# Patient Record
Sex: Male | Born: 1970 | Race: White | Hispanic: No | Marital: Married | Smoking: Never smoker
Health system: Southern US, Community
[De-identification: ages and names within clinical notes are randomized; demographics above are authoritative.]

## PROBLEM LIST (undated history)

## (undated) DIAGNOSIS — K469 Unspecified abdominal hernia without obstruction or gangrene: Secondary | ICD-10-CM

## (undated) HISTORY — PX: HERNIA REPAIR: SHX51

## (undated) HISTORY — PX: OTHER SURGICAL HISTORY: SHX169

## (undated) HISTORY — DX: Unspecified abdominal hernia without obstruction or gangrene: K46.9

---

## 2008-09-27 ENCOUNTER — Emergency Department: Payer: Self-pay | Admitting: Internal Medicine

## 2009-07-02 ENCOUNTER — Emergency Department: Payer: Self-pay | Admitting: Emergency Medicine

## 2009-10-29 ENCOUNTER — Ambulatory Visit: Payer: Self-pay | Admitting: Cardiovascular Disease

## 2009-10-29 DIAGNOSIS — R0789 Other chest pain: Secondary | ICD-10-CM

## 2009-11-14 IMAGING — CR DG HAND COMPLETE 3+V*L*
1 series · 3 of 3 positions shown · non-contrast
Comparison: none

REASON FOR EXAM: injury
COMMENTS:

PROCEDURE:     DXR - DXR HAND LT COMPLETE  W/OBLIQUES  - September 27, 2008 [DATE]
RESULT:     A tiny metallic density is noted in the subcutaneous region
along the thenar eminence. No acute bony abnormality identified.

[Series 1: view not recorded · 0.17mm/px · 3 of 3 slices shown]
[im 1/3]
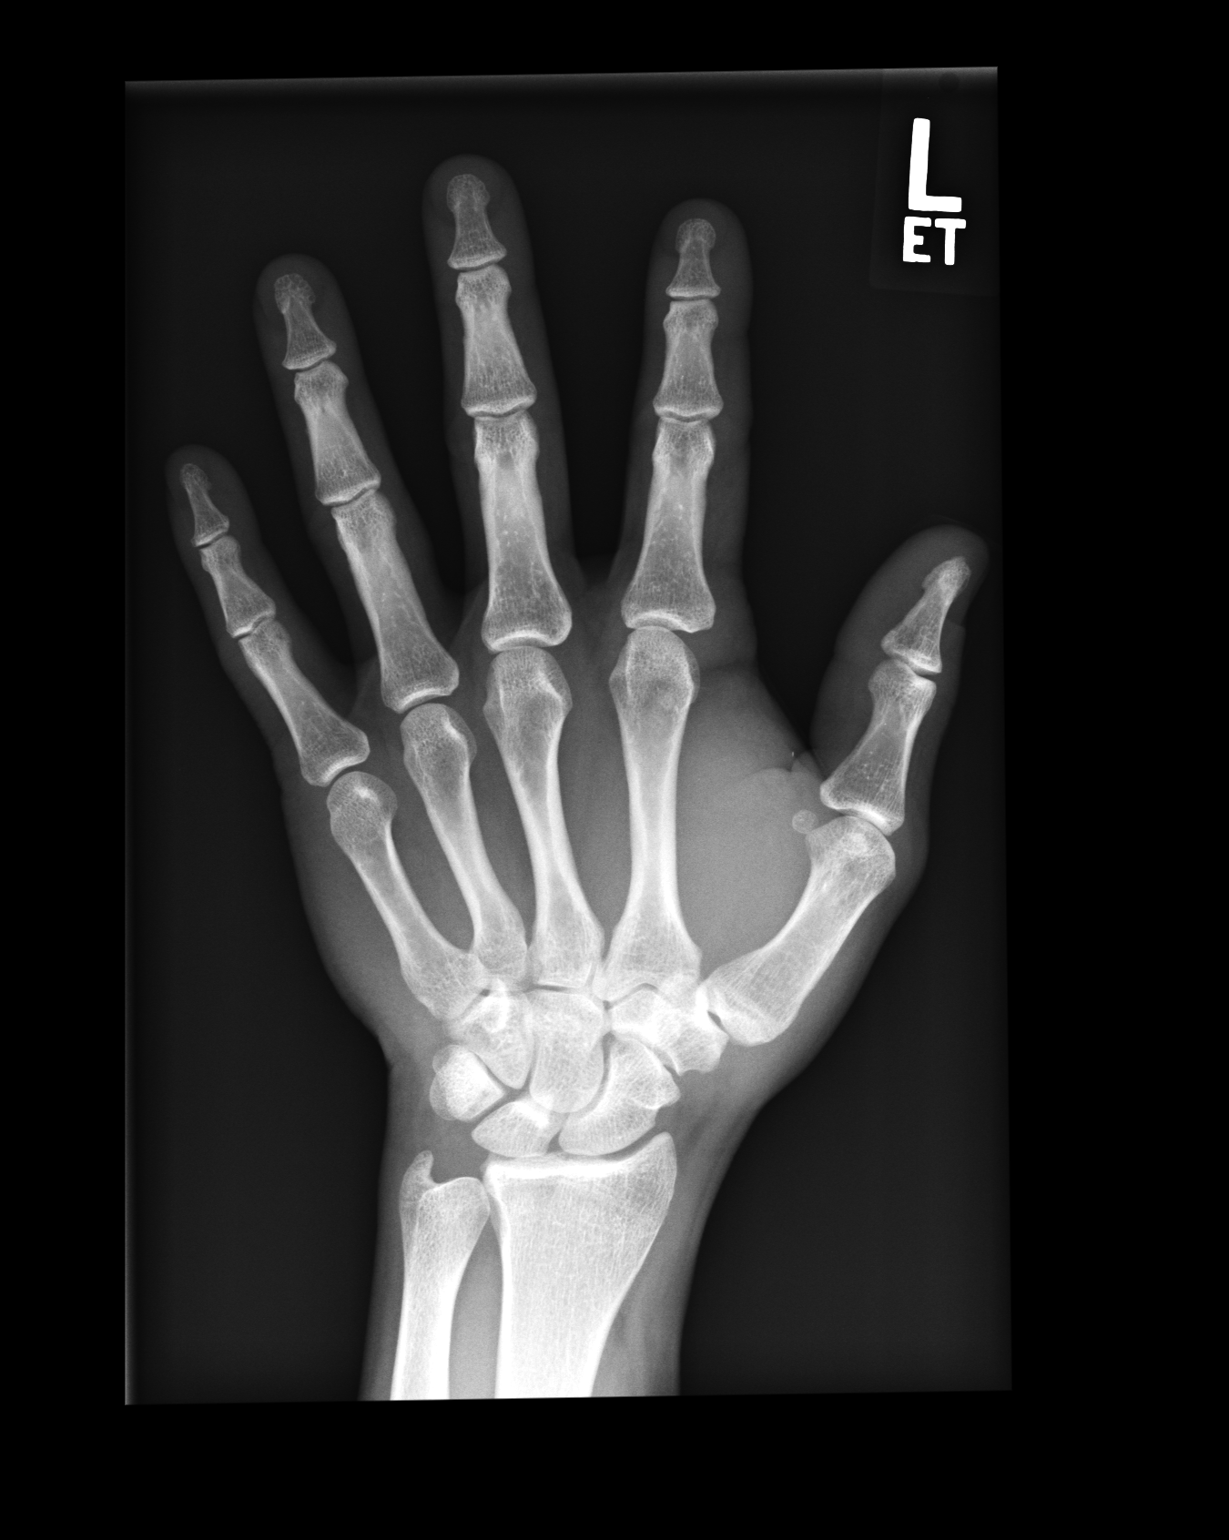
[im 2/3]
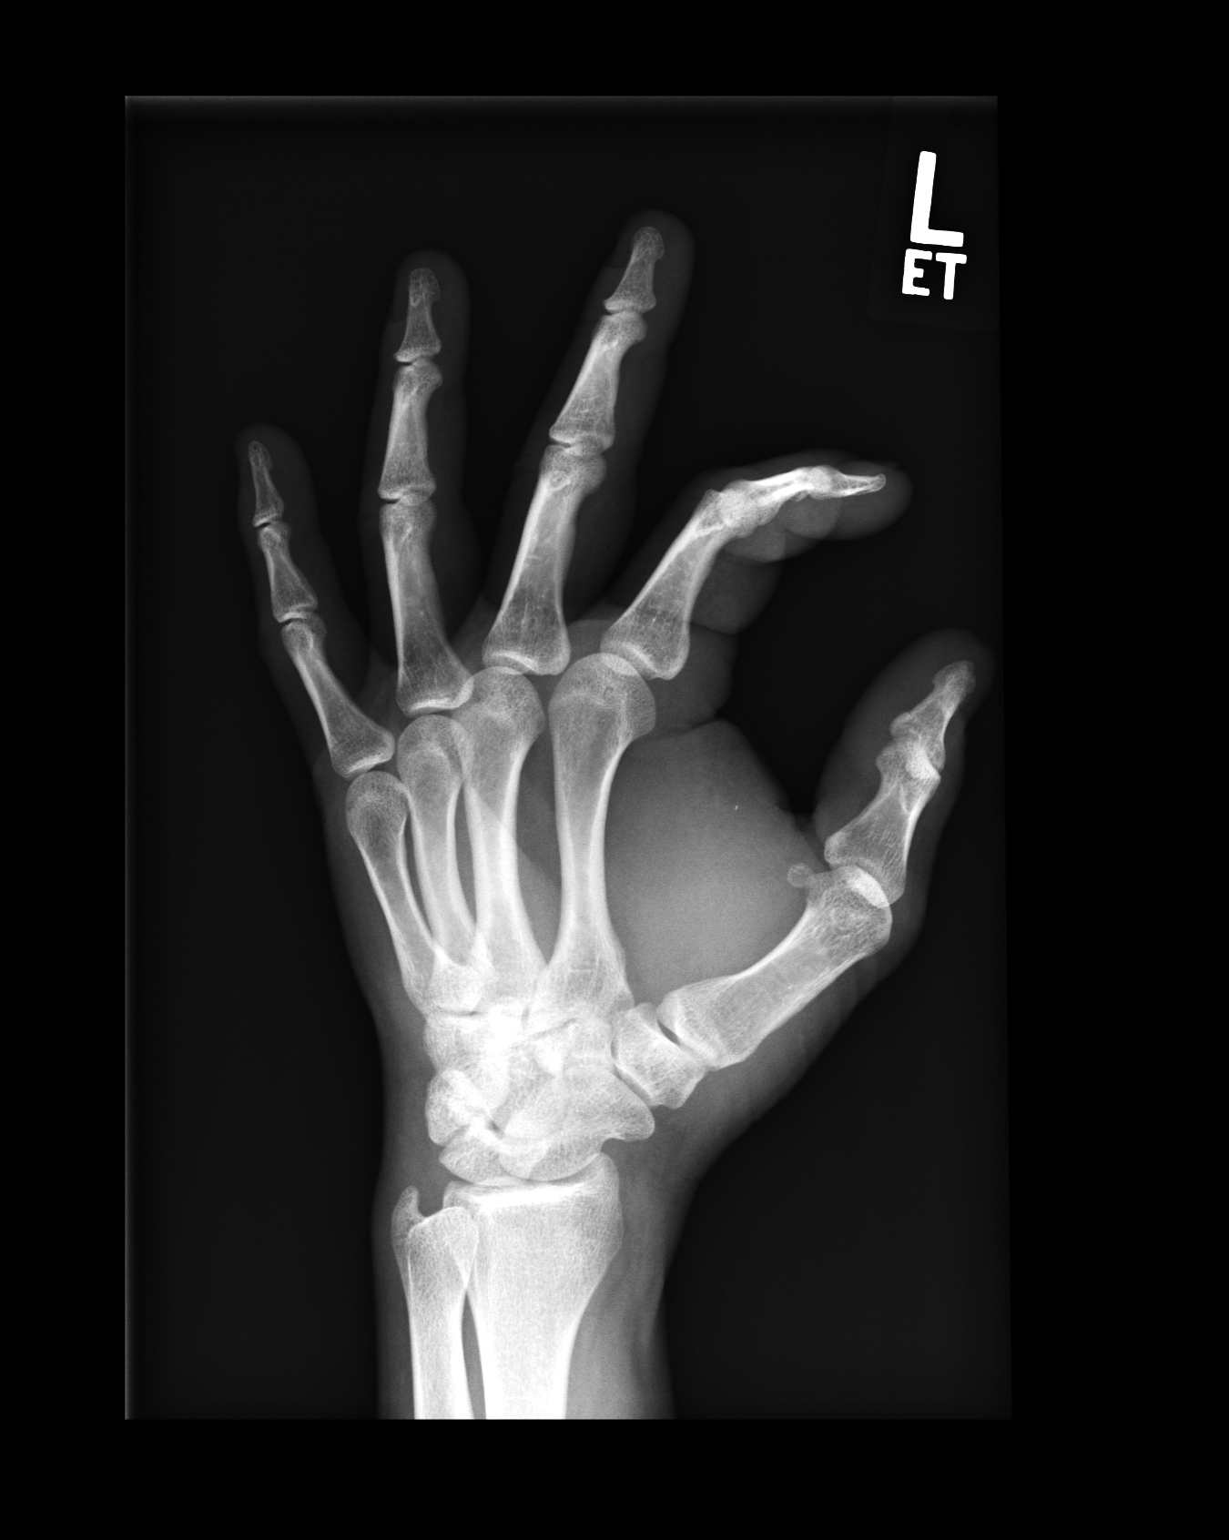
[im 3/3]
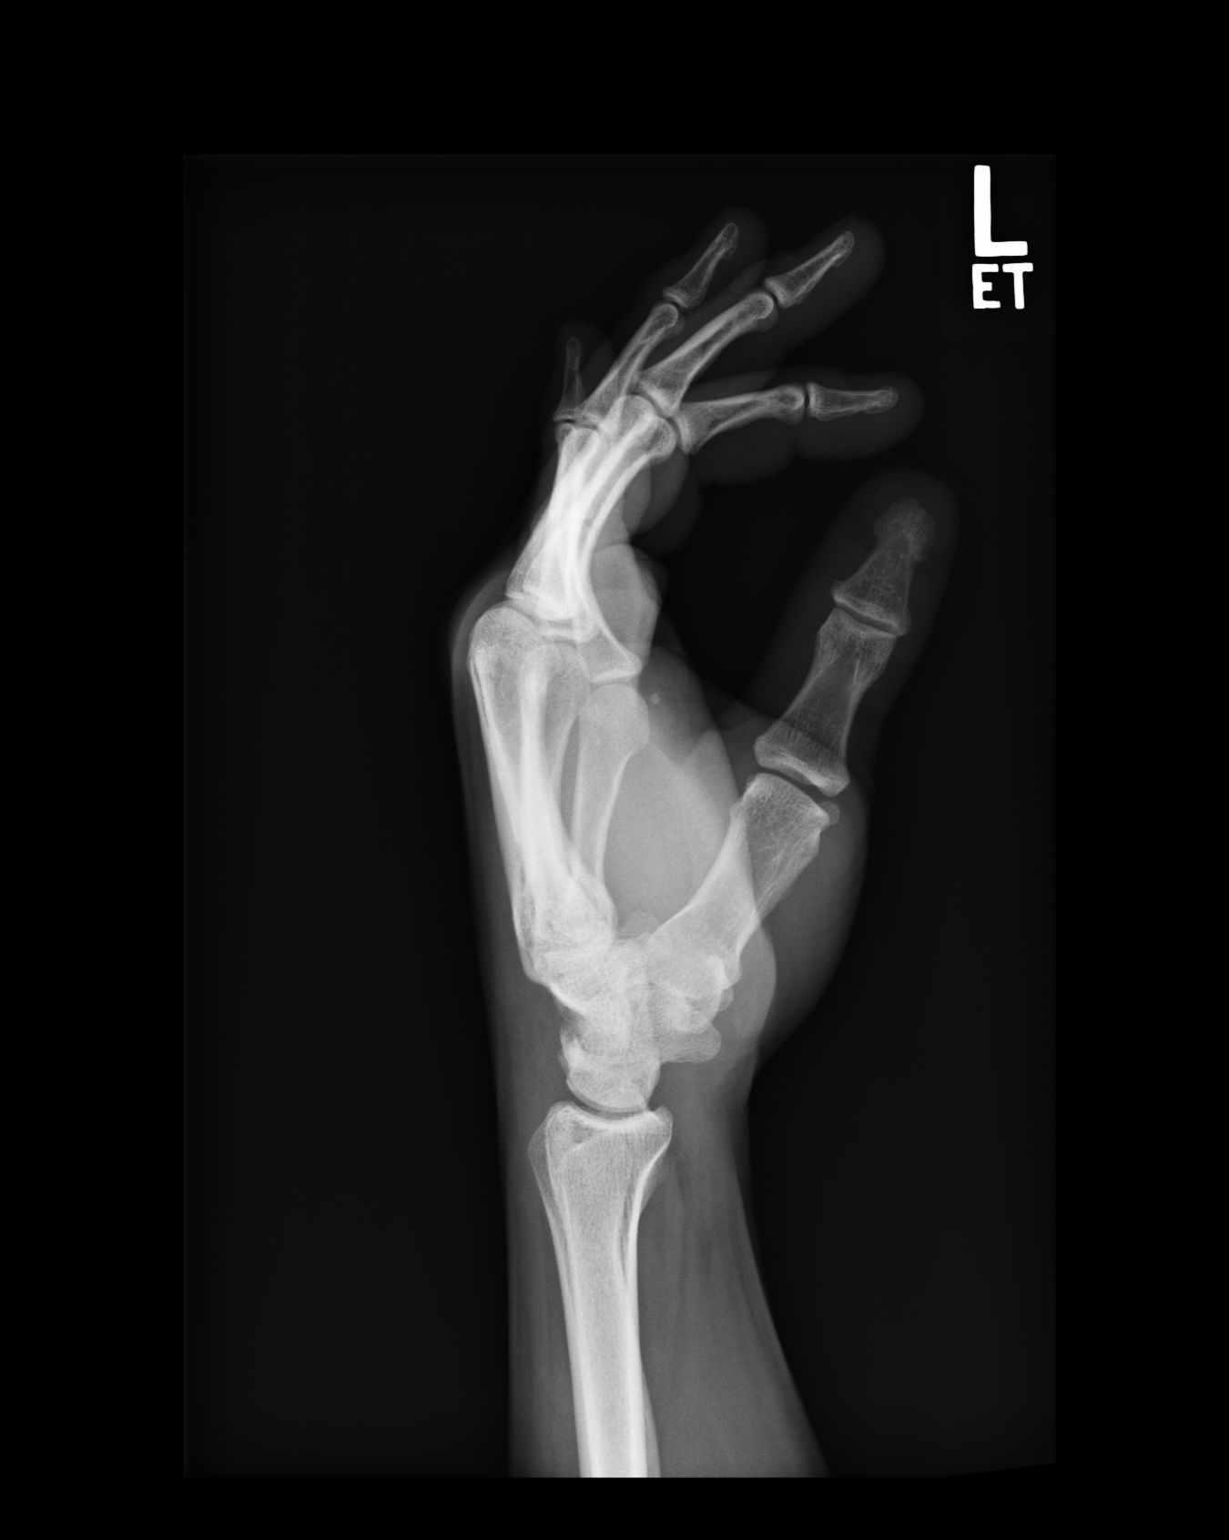

[3 of 3 positions shown; findings below may reference images not displayed]

IMPRESSION: Tiny, sand-like possible foreign body in the subcutaneous
region of the left thenar eminence. No fracture is noted.

## 2010-08-25 NOTE — Progress Notes (Signed)
Summary: PHI  PHI   Imported By: Harlon Flor 11/05/2009 09:07:01  _____________________________________________________________________  External Attachment:    Type:   Image     Comment:   External Document

## 2010-08-25 NOTE — Assessment & Plan Note (Signed)
Summary: NP6/AMD   Visit Type:  New patient Referring Provider:  self ref Primary Provider:  Mila Merry, MD  CC:   some chest pains since sept. seen pcp and thought it was gerd - gave him some medicine and it has not gotten any better. Tyler Buckley  History of Present Illness: Mr. Tyler Buckley is a 40 year old gentleman, patient of Dr. Mila Merry, who presents for evaluation of chest discomfort.  He states that his symptoms started in the fall of last year. His symptoms would come on sometimes at rest, lasting for one to 2 hours and sometimes for several minutes at a time. It was always in the same location which is in the central mediastinal area, sub-xiphoid region. He took medications for GERD and this did not seem to help. There was no relation with exertion, he did not seem to think there was any positional component to his discomfort. He seemed to notice it more in the evenings when he was sitting. He is uncertain of his family history unremarkable check and make sure that everything was okay. He denies lifting anything heavy that he does sometimes lift items at work as he works in [employer] in [employer].  Preventive Screening-Counseling & Management  Alcohol-Tobacco     Smoking Status: never  Caffeine-Diet-Exercise     Does Patient Exercise: yes      Drug Use:  no.    Current Problems (verified): 1)  Chest Discomfort  (ICD-786.59)  Current Medications (verified): 1)  None  Allergies (verified): No Known Drug Allergies  Past History:  Past Medical History: hernia  Past Surgical History: hernia repaired  Family History: Father: unknown Mother: healthy  Social History: Full Time Married  Tobacco Use - No.  Alcohol Use - no Regular Exercise - yes Drug Use - no Smoking Status:  never Does Patient Exercise:  yes Drug Use:  no  Review of Systems       The patient complains of chest pain.  The patient denies fever, weight loss, weight gain, vision loss, decreased  hearing, hoarseness, syncope, dyspnea on exertion, peripheral edema, prolonged cough, abdominal pain, incontinence, muscle weakness, depression, and enlarged lymph nodes.    Vital Signs:  Patient profile:   40 year old male Height:      70 inches Weight:      186.25 pounds BMI:     26.82 Pulse rate:   70 / minute Pulse rhythm:   regular BP sitting:   124 / 86  (left arm) Cuff size:   regular  Vitals Entered By: Mercer Pod (October 29, 2009 3:38 PM)  Physical Exam  General:  well-appearing male in no apparent distress, HEENT exam is benign, oropharynx is clear, neck is supple with no JVP or carotid bruits, heart sounds are regular with S1-S2 and no murmurs appreciated, lungs are clear to auscultation with no wheezes Rales, abdominal exam is benign, no significant lower extremity edema, neurologic exam is nonfocal the skin is warm and dry, pulses are equal and symmetrical in his upper and lower extremities.    EKG  Procedure date:  10/29/2009  Findings:      normal sinus rhythm with rate 70 beats per minute, borderline ST changes in leads V3 through V6, leads 2, 3, aVF.  Impression & Recommendations:  Problem # 1:  CHEST DISCOMFORT (ICD-786.59) etiology of his chest pain is likely non-ischemic. It is certainly possible that he may have had some component of pericarditis. He does have some very nonspecific  ST changes in numerous leads that this may certainly be a benign finding. There is no exertional component to his discomfort. He does not believe it is GERD and proton pump inhibitors has not helped his discomfort.  I have asked him to try nonsteroidal anti-inflammatories if his pain recurs. It has been a week since he's had any discomfort at all. Medication such as ibuprofen may help his symptoms if he does have pericarditis or costochondritis or other muscular ligamental issue if he has no improvement on nonsteroidal anti-inflammatories, we could potentially try low-dose  long-acting nitroglycerin for possible spasm including coronary spasm or esophageal spasm. Lastly if all else fails, he could potentially have a GI workup for possible hiatal hernia or other GI issue.

## 2011-02-08 ENCOUNTER — Encounter: Payer: Self-pay | Admitting: Cardiovascular Disease

## 2015-01-04 ENCOUNTER — Emergency Department
Admission: EM | Admit: 2015-01-04 | Discharge: 2015-01-04 | Disposition: A | Discharge status: Home / Self Care | Payer: No Typology Code available for payment source

## 2015-01-04 NOTE — ED Notes (Signed)
Pt was seen for a post accident drug screen, states that he doesn't need to be eval

## 2015-07-29 ENCOUNTER — Ambulatory Visit: Payer: Self-pay | Admitting: Physician Assistant

## 2015-07-29 ENCOUNTER — Encounter: Payer: Self-pay | Admitting: Physician Assistant

## 2015-07-29 VITALS — BP 120/80 | HR 96 | Temp 98.3°F

## 2015-07-29 DIAGNOSIS — R0982 Postnasal drip: Secondary | ICD-10-CM

## 2015-07-29 MED ORDER — FLUTICASONE PROPIONATE 50 MCG/ACT NA SUSP: 2.0000 | Freq: Every day | NASAL | Status: DC

## 2015-07-29 NOTE — Progress Notes (Signed)
S: C/o runny nose and congestion for 3 days, no fever, chills, cp/sob, v/d; mucus was green this am but clear throughout the day, cough is sporadic, has pressure across face, feels better than he did yesterday  Using otc meds: robitussin  O: PE: vitals wnl, nad, perrl eomi, normocephalic, tms dull, nasal mucosa red and swollen, throat injected, neck supple no lymph, lungs c t a, cv rrr, neuro intact  A:  Acute viral uri   P: flonase, drink fluids, continue regular meds , use otc meds of choice, return if not improving in 5 days, return earlier if worsening

## 2016-06-13 ENCOUNTER — Encounter: Payer: Self-pay | Admitting: Physician Assistant

## 2016-06-13 ENCOUNTER — Ambulatory Visit: Payer: Self-pay | Admitting: Physician Assistant

## 2016-06-13 VITALS — BP 110/80 | HR 80 | Temp 98.6°F

## 2016-06-13 DIAGNOSIS — R51 Headache: Secondary | ICD-10-CM

## 2016-06-13 DIAGNOSIS — R519 Headache, unspecified: Secondary | ICD-10-CM

## 2016-06-13 DIAGNOSIS — J0100 Acute maxillary sinusitis, unspecified: Secondary | ICD-10-CM

## 2016-06-13 MED ORDER — PREDNISONE 10 MG PO TABS: 30.0000 mg | ORAL_TABLET | Freq: Every day | ORAL | 0 refills | Status: DC

## 2016-06-13 MED ORDER — AMOXICILLIN 875 MG PO TABS: 875.0000 mg | ORAL_TABLET | Freq: Two times a day (BID) | ORAL | 0 refills | Status: DC

## 2016-06-13 NOTE — Progress Notes (Signed)
S: C/o facial pain and congestion for 2 days, low grade fever of 99 last night; none this am, denies chills, cp/sob, v/d; states his wife had a bronchial infection but his sx are different Using otc meds: sudafed, advil  O: PE: vitals wnl, nad, perrl eomi, normocephalic, tms dull, nasal mucosa red and swollen, throat injected, neck supple no lymph, lungs c t a, cv rrr, neuro intact  A:  Acute sinusitis   P: drink fluids, continue regular meds , use otc meds of choice, return if not improving in 5 days, return earlier if worsening , amoxil 875mg  bid, prednisone 30mg  qd x 3d

## 2016-10-17 ENCOUNTER — Encounter: Payer: Self-pay | Admitting: Physician Assistant

## 2016-10-17 ENCOUNTER — Ambulatory Visit: Payer: Self-pay | Admitting: Physician Assistant

## 2016-10-17 VITALS — BP 137/85 | HR 67 | Temp 97.8°F | Ht 71.0 in | Wt 188.0 lb

## 2016-10-17 DIAGNOSIS — Z008 Encounter for other general examination: Secondary | ICD-10-CM

## 2016-10-17 DIAGNOSIS — Z0000 Encounter for general adult medical examination without abnormal findings: Secondary | ICD-10-CM

## 2016-10-17 DIAGNOSIS — Z0189 Encounter for other specified special examinations: Secondary | ICD-10-CM

## 2016-10-17 NOTE — Progress Notes (Signed)
S: pt here for wellness physical and biometrics for insurance purposes, no complaints ros neg. PMH:   neg Social: nonsmoker, no etoh or drugs, married 1 daughter,  Fam: mother died of colon ca in 2014; biological father's health hx unknown, is adopted by his mother's husband  O: vitals wnl, nad, ENT wnl, neck supple no lymph, lungs c t a, cv rrr, abd soft nontender bs normal all 4 quads  A: wellness, biometric physical  P: labs today, will do colonoscopy screening at age 46 unless pt has changes in bowel movements or any abdominal pain

## 2016-10-18 LAB — CMP12+LP+TP+TSH+6AC+PSA+CBC…
ALT: 20 IU/L (ref 0–44)
AST: 17 IU/L (ref 0–40)
Albumin/Globulin Ratio: 2 (ref 1.2–2.2)
Albumin: 4.7 g/dL (ref 3.5–5.5)
Alkaline Phosphatase: 73 IU/L (ref 39–117)
BASOS ABS: 0 10*3/uL (ref 0.0–0.2)
BUN/Creatinine Ratio: 13 (ref 9–20)
BUN: 15 mg/dL (ref 6–24)
Basos: 1 %
Bilirubin Total: 0.6 mg/dL (ref 0.0–1.2)
CALCIUM: 9.8 mg/dL (ref 8.7–10.2)
CHLORIDE: 100 mmol/L (ref 96–106)
CHOL/HDL RATIO: 4.6 ratio (ref 0.0–5.0)
CHOLESTEROL TOTAL: 175 mg/dL (ref 100–199)
Creatinine, Ser: 1.14 mg/dL (ref 0.76–1.27)
EOS (ABSOLUTE): 0.2 10*3/uL (ref 0.0–0.4)
Eos: 3 %
Estimated CHD Risk: 0.9 times avg. (ref 0.0–1.0)
FREE THYROXINE INDEX: 2 (ref 1.2–4.9)
GFR calc Af Amer: 90 mL/min/{1.73_m2} (ref >59)
GFR calc non Af Amer: 78 mL/min/{1.73_m2} (ref >59)
GGT: 16 IU/L (ref 0–65)
GLOBULIN, TOTAL: 2.4 g/dL (ref 1.5–4.5)
Glucose: 92 mg/dL (ref 65–99)
HDL: 38 mg/dL — AB (ref >39)
HEMATOCRIT: 48.3 % (ref 37.5–51.0)
Hemoglobin: 16.4 g/dL (ref 13.0–17.7)
IMMATURE GRANS (ABS): 0 10*3/uL (ref 0.0–0.1)
IMMATURE GRANULOCYTES: 0 %
Iron: 122 ug/dL (ref 38–169)
LDH: 172 IU/L (ref 121–224)
LDL Calculated: 115 mg/dL — ABNORMAL HIGH (ref 0–99)
LYMPHS ABS: 2 10*3/uL (ref 0.7–3.1)
LYMPHS: 38 %
MCH: 30 pg (ref 26.6–33.0)
MCHC: 34 g/dL (ref 31.5–35.7)
MCV: 89 fL (ref 79–97)
MONOS ABS: 0.3 10*3/uL (ref 0.1–0.9)
Monocytes: 6 %
NEUTROS PCT: 52 %
Neutrophils Absolute: 2.9 10*3/uL (ref 1.4–7.0)
PHOSPHORUS: 3.2 mg/dL (ref 2.5–4.5)
PLATELETS: 225 10*3/uL (ref 150–379)
PROSTATE SPECIFIC AG, SERUM: 0.9 ng/mL (ref 0.0–4.0)
Potassium: 5 mmol/L (ref 3.5–5.2)
RBC: 5.46 x10E6/uL (ref 4.14–5.80)
RDW: 12.7 % (ref 12.3–15.4)
SODIUM: 141 mmol/L (ref 134–144)
T3 Uptake Ratio: 27 % (ref 24–39)
T4 TOTAL: 7.3 ug/dL (ref 4.5–12.0)
TRIGLYCERIDES: 112 mg/dL (ref 0–149)
TSH: 0.46 u[IU]/mL (ref 0.450–4.500)
Total Protein: 7.1 g/dL (ref 6.0–8.5)
URIC ACID: 5.7 mg/dL (ref 3.7–8.6)
VLDL Cholesterol Cal: 22 mg/dL (ref 5–40)
WBC: 5.4 10*3/uL (ref 3.4–10.8)

## 2016-10-18 LAB — HIV ANTIBODY (ROUTINE TESTING W REFLEX): HIV SCREEN 4TH GENERATION: NONREACTIVE

## 2016-10-18 LAB — HEPATITIS C ANTIBODY (REFLEX)

## 2016-10-18 LAB — VITAMIN D 25 HYDROXY (VIT D DEFICIENCY, FRACTURES): Vit D, 25-Hydroxy: 42.7 ng/mL (ref 30.0–100.0)

## 2016-10-18 LAB — HCV COMMENT:

## 2016-11-03 ENCOUNTER — Ambulatory Visit: Payer: Self-pay | Admitting: Physician Assistant

## 2016-11-03 VITALS — BP 130/80 | HR 119 | Temp 99.2°F

## 2016-11-03 DIAGNOSIS — J069 Acute upper respiratory infection, unspecified: Secondary | ICD-10-CM

## 2016-11-03 LAB — POCT INFLUENZA A/B
INFLUENZA B, POC: NEGATIVE
Influenza A, POC: NEGATIVE

## 2016-11-03 NOTE — Progress Notes (Signed)
S: C/o runny nose and congestion with dry cough for 2 days, + fever, chills, denies cp/sob, v/d; mucus was clear throughout the day, cough is sporadic, feels a little achey, sx started suddenly yesterday  Using otc meds:   O: PE: vitals wnl, nad,  perrl eomi, normocephalic, tms dull, nasal mucosa red and swollen, throat injected, neck supple no lymph, lungs c t a, cv rrr, neuro intact, flu swab neg  A:  Acute flu like illness   P: drink fluids, continue regular meds , use otc meds of choice, return if not improving in 5 days, return earlier if worsening , pt doesn't want to by tamiflu due to cost, will use otc theraflu, recommend he not work tonight due to temp

## 2018-01-05 ENCOUNTER — Ambulatory Visit: Payer: Self-pay | Admitting: Family Medicine

## 2018-01-05 VITALS — BP 131/79 | HR 69 | Resp 16 | Ht 71.0 in | Wt 190.0 lb

## 2018-01-05 DIAGNOSIS — Z008 Encounter for other general examination: Secondary | ICD-10-CM

## 2018-01-05 DIAGNOSIS — Z0189 Encounter for other specified special examinations: Principal | ICD-10-CM

## 2018-01-05 NOTE — Progress Notes (Signed)
Subjective: Annual biometrics screening  Patient presents for his annual biometric screening. Patient reports eating a healthy, well-rounded diet and getting regular physical activity.  Patient does not regularly see a primary care provider. PCP: None currently. Patient works for the [employer]. Patient denies any other issues or concerns.   Review of Systems Unremarkable  Objective  Physical Exam General: Awake, alert and oriented. No acute distress. Well developed, hydrated and nourished. Appears stated age.  HEENT: Supple neck without adenopathy. Sclera is non-icteric. The ear canal is clear without discharge. The tympanic membrane is normal in appearance with normal landmarks and cone of light. Nasal mucosa is pink and moist. Oral mucosa is pink and moist. The pharynx is normal in appearance without tonsillar swelling or exudates.  Skin: Skin in warm, dry and intact without rashes or lesions. Appropriate color for ethnicity. Cardiac: Heart rate and rhythm are normal. No murmurs, gallops, or rubs are auscultated.  Respiratory: The chest wall is symmetric and without deformity. No signs of respiratory distress. Lung sounds are clear in all lobes bilaterally without rales, ronchi, or wheezes.  Neurological: The patient is awake, alert and oriented to person, place, and time with normal speech.  Memory is normal and thought processes intact. No gait abnormalities are appreciated.  Psychiatric: Appropriate mood and affect.   Assessment Annual biometrics screening  Plan  Lipid panel and fasting blood sugar pending. Encouraged routine visits with primary care provider.  Provided patient with a list of local resources and encouraged him to establish care with a primary care provider. Encouraged patient to get regular exercise and eat a healthy, well-rounded diet.

## 2018-01-06 LAB — LIPID PANEL
CHOLESTEROL TOTAL: 176 mg/dL (ref 100–199)
Chol/HDL Ratio: 4.4 ratio (ref 0.0–5.0)
HDL: 40 mg/dL (ref >39)
LDL Calculated: 118 mg/dL — ABNORMAL HIGH (ref 0–99)
Triglycerides: 89 mg/dL (ref 0–149)
VLDL CHOLESTEROL CAL: 18 mg/dL (ref 5–40)

## 2018-01-06 LAB — GLUCOSE, RANDOM: GLUCOSE: 101 mg/dL — AB (ref 65–99)

## 2018-01-08 NOTE — Progress Notes (Signed)
Dear Mr. Tyler Buckley, I wanted to let you know that your lipid panel and fasting blood sugar came back.  Everything is normal, with the exception of your LDL cholesterol and fasting blood sugar. Your LDL cholesterol ("bad cholesterol") is elevated at 118, normal values are below 99.  Last year your LDL cholesterol was 115.  Your fasting blood sugar is slightly elevated at 101, normal values are between 65 and 99.  This may represent "prediabetes" but further evaluation is necessary.  I  would like you to follow-up with your primary care provider regarding these results.

## 2018-05-07 ENCOUNTER — Ambulatory Visit: Payer: Self-pay | Admitting: Family Medicine

## 2018-05-07 VITALS — BP 122/76 | HR 85 | Temp 98.4°F | Resp 14 | Ht 71.0 in | Wt 185.0 lb

## 2018-05-07 DIAGNOSIS — J019 Acute sinusitis, unspecified: Secondary | ICD-10-CM

## 2018-05-07 MED ORDER — AMOXICILLIN-POT CLAVULANATE 875-125 MG PO TABS: 1.0000 | ORAL_TABLET | Freq: Two times a day (BID) | ORAL | 0 refills | Status: AC

## 2018-05-07 NOTE — Progress Notes (Signed)
Subjective: congestion     Tyler Buckley is a 47 y.o. male who presents for evaluation of nasal congestion, occasional mild nonproductive cough, and facial pressure.  Patient reports symptoms began 1 week ago and believes they were improving until Saturday when he began to experience bilateral facial pressure.  Patient reports getting approximately 1 sinus infection each year around this time.  Reports facial pressure has remained stable since Saturday. Treatment to date: Nettie pot, Sudafed, Advil.  Denies rash, nausea, vomiting, diarrhea, SOB, wheezing, chest or back pain, ear pain, sore throat, difficulty swallowing, confusion, headache, body aches, fatigue, fever, chills, or severe symptoms.  Denies rapid progression of symptoms. History of smoking, asthma, COPD: Negative. History of recurrent sinus and/or lung infections: Negative. Medical history: Patient denies any. Antibiotic use in the last 3 months: Patient denies.   Review of Systems Pertinent items noted in HPI and remainder of comprehensive ROS otherwise negative.     Objective:   Physical Exam General: Awake, alert, and oriented. No acute distress. Well developed, hydrated and nourished. Appears stated age. Nontoxic appearance.  HEENT:  PND noted.  No erythema to posterior oropharynx.  No edema or exudates of pharynx or tonsils. No erythema or bulging of TM.  Mild erythema/edema to nasal mucosa. Sinuses nontender. Supple neck without adenopathy. Cardiac: Heart rate and rhythm are normal. No murmurs, gallops, or rubs are auscultated. S1 and S2 are heard and are of normal intensity.  Respiratory: No signs of respiratory distress. Lungs clear. No tachypnea. Able to speak in full sentences without dyspnea. Nonlabored respirations.  Skin: Skin is warm, dry and intact. Appropriate color for ethnicity. No cyanosis noted.   Assessment:    Sinusitis  Plan:    Discussed the diagnosis and treatment of sinusitis. Suggested  symptomatic OTC remedies. Nasal saline spray for congestion.   Prescribed Augmentin.  Discussed side/adverse effects. Discussed red flag symptoms and circumstances with which to seek medical care.   New Prescriptions   AMOXICILLIN-CLAVULANATE (AUGMENTIN) 875-125 MG TABLET    Take 1 tablet by mouth 2 (two) times daily for 10 days.

## 2019-05-16 ENCOUNTER — Ambulatory Visit: Payer: Managed Care, Other (non HMO) | Admitting: Adult Health

## 2019-05-16 ENCOUNTER — Other Ambulatory Visit: Payer: Self-pay

## 2019-05-16 ENCOUNTER — Encounter: Payer: Self-pay | Admitting: Adult Health

## 2019-05-16 VITALS — BP 106/76 | HR 75 | Temp 98.0°F | Resp 18 | Ht 72.0 in | Wt 190.0 lb

## 2019-05-16 DIAGNOSIS — Z008 Encounter for other general examination: Secondary | ICD-10-CM | POA: Diagnosis not present

## 2019-05-16 NOTE — Progress Notes (Signed)
[facility]  CATALDO COSGRIFF DOB: 48 y.o. MRN: [id number]  Subjective:  Here for Biometric Screen/brief exam Patient is a 48 year old male in no acute distress who comes to the clinic for his brief biometric exam and biometric screening.  He is employed with [employer].  He denies any concerns for today's visit. He denies ever smoking or vaping. He does not drink alcohol. He denies any other drug use. He does exercise regularly He tries to eat healthy.  He denies any chronic medical problems.  He is not taking any medications daily.,  He asks about medications he can take for his nasal allergies in the fall season, advised to start Zyrtec 10 mg daily and follow-up if symptoms do not improve. He does not have any respiratory symptoms at this time, he reports that she usually starts every year between November to February.  He has a history of chest pain and his problem list, he reports this is resolved he is not having chest pain currently or in " years"..  He has been evaluated for this. He is using Flonase nasal spray daily.  Patient  denies any fever, body aches,chills, rash, chest pain, shortness of breath, nausea, vomiting, or diarrhea.  Allergies  Allergen Reactions  . Other    Patient Active Problem List   Diagnosis Date Noted  . CHEST DISCOMFORT 10/29/2009  No current outpatient medications on file.  Objective: Blood pressure 106/76, pulse 75, temperature 98 F (36.7 C), temperature source Temporal, resp. rate 18, height 6' (1.829 m), weight 190 lb (86.2 kg), SpO2 100 %. Patient is alert and oriented and responsive to questions Engages in eye contact with provider. Speaks in full sentences without any pauses without any shortness of breath or distress.  NAD, well-developed well-nourished HEENT: Within normal limits Neck: Normal, supple, thyroid normal, no cervical lymphadenopathy posterior or anteriorly Heart:  Regular rate and rhythm without murmurs rubs or gallops Lungs: Clear to auscultation without any adventitious lung sounds Neuro: Patient moves on and off of exam table and in room without difficulty. Gait is normal in hall and in room.   Assessment: Biometric screen Encounter for other general examination-brief exam with biometric screening-this is not a full annual physical. - Plan: Glucose, random, Lipid panel  Encounter for biometric screening - Plan: Glucose, random, Lipid panel    Plan:  I will have the office call you on your glucose and cholesterol results when they return if you have not heard within 1 week please call the office or sent to your Mychart account. Please call the clinic during office hours with any questions or concerns.  This biometric physical is a brief physical and the only labs done are glucose and your lipid panel(cholesterol) and is  not a substitute for seeing a primary care provider for a complete annual physical. Please see a primary care physician for routine health maintenance, labs and full physical at least yearly and follow up as recommended by your provider. Provider also recommends if you do not have a primary care provider for patient to establish care as soon as possible .Patient may chose provider of choice. Also gave the [facility]  PHYSICIAN/PROVIDER  REFERRAL LINE at [phone] or web site at [email or web address] to help assist with finding a primary care doctor.  Patient verbalizes understanding that his office is acute care only and not a substitute for a primary care or for the management of chronic conditions.  Fasting glucose and lipids. Discussed with patient that today's visit here is a limited biometric screening visit (not a comprehensive exam or management of any chronic problems) Discussed some health issues, including healthy eating habits and exercise. Encouraged to follow-up with PCP for annual comprehensive preventive and wellness  care (and if applicable, any chronic issues). Questions invited and answered.

## 2019-05-16 NOTE — Patient Instructions (Signed)
The Biometric exam is a brief physical with labs including glucose and cholesterol. This does not replace a full physical with a primary care provider, and additional recommended labs. This is an acute care clinic not for maintenance of chronic or long standing conditions.   Provider also recommends if you do not have a primary care provider for patient to establish care promptly.You can choose any provider of your choice at any facility of your choice, the below information is  just a resource to aid in you finding a primary care provider for routine health maintenance.   [facility]  PHYSICIAN/PROVIDER  REFERRAL LINE at [phone]  [email or web address] to help assist with finding a primary care doctor.   Helpful resources below of other primary care office's accepting new patients.    [facility]         [address]  [place] [address] [phone]   [facility]    [address], [address] [place], [place] [phone]) [phone]   [facility] [address]  [place], [place]  [phone]) [phone]    [facility]  [address], [address] [place] [address] [phone]) [phone]    Follow up with primary care as needed for chronic and maintenance health care- can be seen in this employee clinic for acute care.     I will have the office call you on your glucose and cholesterol results when they return if you have not heard within 1 week please call the office or sent to your Mychart account. Please call the clinic during office hours with any questions or concerns.  This biometric physical is a brief physical and the only labs done are glucose and your lipid panel(cholesterol) and is  not a substitute for seeing a primary care provider for a complete annual physical. Please see a primary care physician for routine health maintenance, labs and full physical at least yearly  and follow up as recommended by your provider. Provider also recommends if you do not have a primary care provider for patient to establish care as soon as possible .Patient may chose provider of choice. Also gave the [facility]  PHYSICIAN/PROVIDER  REFERRAL LINE at [phone] or web site at [email or web address] to help assist with finding a primary care doctor.  Patient verbalizes understanding that his office is acute care only and not a substitute for a primary care or for the management of chronic conditions.    Fluticasone nasal spray What is this medicine? FLUTICASONE (floo TIK a sone) is a corticosteroid. This medicine is used to treat the symptoms of allergies like sneezing, itchy red eyes, and itchy, runny, or stuffy nose. This medicine is also used to treat nasal polyps. This medicine may be used for other purposes; ask your health care provider or pharmacist if you have questions. COMMON BRAND NAME(S): ClariSpray, Flonase, Flonase Allergy Relief, Flonase Sensimist, Veramyst, XHANCE What should I tell my health care provider before I take this medicine? They need to know if you have any of these conditions:  eye disease, vision problems  infection, like tuberculosis, herpes, or fungal infection  recent surgery on nose or sinuses  taking a corticosteroid by mouth  an unusual or allergic reaction to fluticasone, steroids, other medicines, foods, dyes, or preservatives  pregnant or trying to get pregnant  breast-feeding How should I use this medicine? This medicine is for use in the nose.  Follow the directions on your product or prescription label. This medicine works best if used at regular intervals. Do not use more often than directed. Make sure that you are using your nasal spray correctly. After 6 months of daily use for allergies, talk to your doctor or health care professional before using it for a longer time. Ask your doctor or health care professional if you have any  questions. Talk to your pediatrician regarding the use of this medicine in children. Special care may be needed. Some products have been used for allergies in children as young as 2 years. After 2 months of daily use without a prescription in a child, talk to your pediatrician before using it for a longer time. Use of this medicine for nasal polyps is not approved in children. Overdosage: If you think you have taken too much of this medicine contact a poison control center or emergency room at once. NOTE: This medicine is only for you. Do not share this medicine with others. What if I miss a dose? If you miss a dose, use it as soon as you remember. If it is almost time for your next dose, use only that dose and continue with your regular schedule. Do not use double or extra doses. What may interact with this medicine?  certain antibiotics like clarithromycin and telithromycin  certain medicines for fungal infections like ketoconazole, itraconazole, and voriconazole  conivaptan  nefazodone  some medicines for HIV  vaccines This list may not describe all possible interactions. Give your health care provider a list of all the medicines, herbs, non-prescription drugs, or dietary supplements you use. Also tell them if you smoke, drink alcohol, or use illegal drugs. Some items may interact with your medicine. What should I watch for while using this medicine? Visit your healthcare professional for regular checks on your progress. Tell your healthcare professional if your symptoms do not start to get better or if they get worse. This medicine may increase your risk of getting an infection. Tell your doctor or health care professional if you are around anyone with measles or chickenpox, or if you develop sores or blisters that do not heal properly. What side effects may I notice from receiving this medicine? Side effects that you should report to your doctor or health care professional as soon as  possible:  allergic reactions like skin rash, itching or hives, swelling of the face, lips, or tongue  changes in vision  crusting or sores in the nose  nosebleed  signs and symptoms of infection like fever or chills; cough; sore throat  white patches or sores in the mouth or nose Side effects that usually do not require medical attention (report to your doctor or health care professional if they continue or are bothersome):  burning or irritation inside the nose or throat  changes in taste or smell  cough  headache This list may not describe all possible side effects. Call your doctor for medical advice about side effects. You may report side effects to FDA at 1-800-FDA-1088. Where should I keep my medicine? Keep out of the reach of children. Store at room temperature between 15 and 30 degrees C (59 and 86 degrees F). Avoid exposure to extreme heat, cold, or light. Throw away any unused medicine after the expiration date. NOTE: This sheet is a summary. It may not cover all possible information. If you have questions about this medicine, talk to your doctor, pharmacist, or health care provider.  2020 Elsevier/Gold Standard (  2017-08-03 14:10:08) Cetirizine tablets What is this medicine? CETIRIZINE (se TI ra zeen) is an antihistamine. This medicine is used to treat or prevent symptoms of allergies. It is also used to help reduce itchy skin rash and hives. This medicine may be used for other purposes; ask your health care provider or pharmacist if you have questions. COMMON BRAND NAME(S): All Day Allergy, Allergy Relief, Zyrtec, Zyrtec Hives Relief What should I tell my health care provider before I take this medicine? They need to know if you have any of these conditions:  kidney disease  liver disease  an unusual or allergic reaction to cetirizine, hydroxyzine, other medicines, foods, dyes, or preservatives  pregnant or trying to get pregnant  breast-feeding How should I  use this medicine? Take this medicine by mouth with a glass of water. Follow the directions on the prescription label. You can take this medicine with food or on an empty stomach. Take your medicine at regular times. Do not take more often than directed. You may need to take this medicine for several days before your symptoms improve. Talk to your pediatrician regarding the use of this medicine in children. Special care may be needed. While this drug may be prescribed for children as young as 33 years of age for selected conditions, precautions do apply. Overdosage: If you think you have taken too much of this medicine contact a poison control center or emergency room at once. NOTE: This medicine is only for you. Do not share this medicine with others. What if I miss a dose? If you miss a dose, take it as soon as you can. If it is almost time for your next dose, take only that dose. Do not take double or extra doses. What may interact with this medicine?  alcohol  certain medicines for anxiety or sleep  narcotic medicines for pain  other medicines for colds or allergies This list may not describe all possible interactions. Give your health care provider a list of all the medicines, herbs, non-prescription drugs, or dietary supplements you use. Also tell them if you smoke, drink alcohol, or use illegal drugs. Some items may interact with your medicine. What should I watch for while using this medicine? Visit your doctor or health care professional for regular checks on your health. Tell your doctor if your symptoms do not improve. You may get drowsy or dizzy. Do not drive, use machinery, or do anything that needs mental alertness until you know how this medicine affects you. Do not stand or sit up quickly, especially if you are an older patient. This reduces the risk of dizzy or fainting spells. Your mouth may get dry. Chewing sugarless gum or sucking hard candy, and drinking plenty of water may  help. Contact your doctor if the problem does not go away or is severe. What side effects may I notice from receiving this medicine? Side effects that you should report to your doctor or health care professional as soon as possible:  allergic reactions like skin rash, itching or hives, swelling of the face, lips, or tongue  changes in vision or hearing  fast or irregular heartbeat  trouble passing urine or change in the amount of urine Side effects that usually do not require medical attention (report to your doctor or health care professional if they continue or are bothersome):  dizziness  dry mouth  irritability  sore throat  stomach pain  tiredness This list may not describe all possible side effects. Call your doctor  for medical advice about side effects. You may report side effects to FDA at 1-800-FDA-1088. Where should I keep my medicine? Keep out of the reach of children. Store at room temperature between 15 and 30 degrees C (59 and 86 degrees F). Throw away any unused medicine after the expiration date. NOTE: This sheet is a summary. It may not cover all possible information. If you have questions about this medicine, talk to your doctor, pharmacist, or health care provider.  2020 Elsevier/Gold Standard (2014-08-05 13:44:42) Health Maintenance, Male Adopting a healthy lifestyle and getting preventive care are important in promoting health and wellness. Ask your health care provider about:  The right schedule for you to have regular tests and exams.  Things you can do on your own to prevent diseases and keep yourself healthy. What should I know about diet, weight, and exercise? Eat a healthy diet   Eat a diet that includes plenty of vegetables, fruits, low-fat dairy products, and lean protein.  Do not eat a lot of foods that are high in solid fats, added sugars, or sodium. Maintain a healthy weight Body mass index (BMI) is a measurement that can be used to  identify possible weight problems. It estimates body fat based on height and weight. Your health care provider can help determine your BMI and help you achieve or maintain a healthy weight. Get regular exercise Get regular exercise. This is one of the most important things you can do for your health. Most adults should:  Exercise for at least 150 minutes each week. The exercise should increase your heart rate and make you sweat (moderate-intensity exercise).  Do strengthening exercises at least twice a week. This is in addition to the moderate-intensity exercise.  Spend less time sitting. Even light physical activity can be beneficial. Watch cholesterol and blood lipids Have your blood tested for lipids and cholesterol at 47 years of age, then have this test every 5 years. You may need to have your cholesterol levels checked more often if:  Your lipid or cholesterol levels are high.  You are older than 47 years of age.  You are at high risk for heart disease. What should I know about cancer screening? Many types of cancers can be detected early and may often be prevented. Depending on your health history and family history, you may need to have cancer screening at various ages. This may include screening for:  Colorectal cancer.  Prostate cancer.  Skin cancer.  Lung cancer. What should I know about heart disease, diabetes, and high blood pressure? Blood pressure and heart disease  High blood pressure causes heart disease and increases the risk of stroke. This is more likely to develop in people who have high blood pressure readings, are of African descent, or are overweight.  Talk with your health care provider about your target blood pressure readings.  Have your blood pressure checked: ? Every 3-5 years if you are 51-28 years of age. ? Every year if you are 90 years old or older.  If you are between the ages of 48 and 71 and are a current or former smoker, ask your health  care provider if you should have a one-time screening for abdominal aortic aneurysm (AAA). Diabetes Have regular diabetes screenings. This checks your fasting blood sugar level. Have the screening done:  Once every three years after age 63 if you are at a normal weight and have a low risk for diabetes.  More often and at a younger age  if you are overweight or have a high risk for diabetes. What should I know about preventing infection? Hepatitis B If you have a higher risk for hepatitis B, you should be screened for this virus. Talk with your health care provider to find out if you are at risk for hepatitis B infection. Hepatitis C Blood testing is recommended for:  Everyone born from [birth year] through 1964.  Anyone with known risk factors for hepatitis C. Sexually transmitted infections (STIs)  You should be screened each year for STIs, including gonorrhea and chlamydia, if: ? You are sexually active and are younger than 47 years of age. ? You are older than 47 years of age and your health care provider tells you that you are at risk for this type of infection. ? Your sexual activity has changed since you were last screened, and you are at increased risk for chlamydia or gonorrhea. Ask your health care provider if you are at risk.  Ask your health care provider about whether you are at high risk for HIV. Your health care provider may recommend a prescription medicine to help prevent HIV infection. If you choose to take medicine to prevent HIV, you should first get tested for HIV. You should then be tested every 3 months for as long as you are taking the medicine. Follow these instructions at home: Lifestyle  Do not use any products that contain nicotine or tobacco, such as cigarettes, e-cigarettes, and chewing tobacco. If you need help quitting, ask your health care provider.  Do not use street drugs.  Do not share needles.  Ask your health care provider for help if you need support or  information about quitting drugs. Alcohol use  Do not drink alcohol if your health care provider tells you not to drink.  If you drink alcohol: ? Limit how much you have to 0-2 drinks a day. ? Be aware of how much alcohol is in your drink. In the [place] one drink equals one 12 oz bottle of beer (355 mL), one 5 oz glass of wine (148 mL), or one 1 oz glass of hard liquor (44 mL). General instructions  Schedule regular health, dental, and eye exams.  Stay current with your vaccines.  Tell your health care provider if: ? You often feel depressed. ? You have ever been abused or do not feel safe at home. Summary  Adopting a healthy lifestyle and getting preventive care are important in promoting health and wellness.  Follow your health care provider's instructions about healthy diet, exercising, and getting tested or screened for diseases.  Follow your health care provider's instructions on monitoring your cholesterol and blood pressure. This information is not intended to replace advice given to you by your health care provider. Make sure you discuss any questions you have with your health care provider. Document Released: 01/07/2008 Document Revised: 07/04/2018 Document Reviewed: 07/04/2018 Elsevier Patient Education  2020 [employer].

## 2019-05-17 ENCOUNTER — Encounter: Payer: Self-pay | Admitting: Adult Health

## 2019-05-17 LAB — LIPID PANEL
Chol/HDL Ratio: 4.2 ratio (ref 0.0–5.0)
Cholesterol, Total: 167 mg/dL (ref 100–199)
HDL: 40 mg/dL (ref >39)
LDL Chol Calc (NIH): 112 mg/dL — ABNORMAL HIGH (ref 0–99)
Triglycerides: 78 mg/dL (ref 0–149)
VLDL Cholesterol Cal: 15 mg/dL (ref 5–40)

## 2019-05-17 LAB — GLUCOSE, RANDOM: Glucose: 105 mg/dL — ABNORMAL HIGH (ref 65–99)

## 2020-04-14 ENCOUNTER — Encounter: Payer: Self-pay | Admitting: Physician Assistant

## 2020-04-14 ENCOUNTER — Other Ambulatory Visit: Payer: Self-pay

## 2020-04-14 ENCOUNTER — Ambulatory Visit: Payer: Managed Care, Other (non HMO) | Admitting: Physician Assistant

## 2020-04-14 VITALS — BP 140/88 | HR 64 | Temp 97.8°F | Resp 16 | Ht 71.0 in | Wt 190.0 lb

## 2020-04-14 DIAGNOSIS — Z008 Encounter for other general examination: Secondary | ICD-10-CM | POA: Diagnosis not present

## 2020-04-14 NOTE — Progress Notes (Signed)
   Subjective:    Patient ID: Tyler Buckley, male    DOB: 1971/06/21, 49 y.o.   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  Works out regularly in gym with [employer].  EMT trained  Feels well . Doing well- concerned that BP up slightly today- will F/U- denies hx    190 lb  /BMI  26.5 - 5'11"  Had Covid vaccine 25 Jun 2019/ 27 Jul 2019  Previously with [facility] but has been years since prior care- encouraged to establish care trail.   Review of Systems Mother had colon cancer- counsel PCP connection and screening colonoscopy - denies any abnormal symptoms    Non smoker-never smoker Objective:   Physical Exam Vitals and nursing note reviewed.  Constitutional:      Appearance: Normal appearance.     Comments: WDWN WM in NAD  HENT:     Head: Normocephalic and atraumatic.     Right Ear: Tympanic membrane, ear canal and external ear normal.     Left Ear: Tympanic membrane, ear canal and external ear normal.     Ears:     Comments: Q tip precautions reviewed    Nose: Nose normal.     Mouth/Throat:     Mouth: Mucous membranes are moist.  Eyes:     Extraocular Movements: Extraocular movements intact.     Conjunctiva/sclera: Conjunctivae normal.  Cardiovascular:     Rate and Rhythm: Normal rate and regular rhythm.     Pulses: Normal pulses.     Heart sounds: Normal heart sounds.  Pulmonary:     Effort: Pulmonary effort is normal.     Breath sounds: Normal breath sounds.  Abdominal:     General: Abdomen is flat. Bowel sounds are normal.     Palpations: Abdomen is soft. There is no mass.     Tenderness: There is no right CVA tenderness, left CVA tenderness or guarding.     Comments: S/p Right inguinal hernia and repair- no left sided symptoms  Genitourinary:    Comments: Exam deferred- denies concerns Musculoskeletal:         General: Normal range of motion.     Cervical back: Normal range of motion and neck supple.  Skin:    General: Skin is warm and dry.     Capillary Refill: Capillary refill takes less than 2 seconds.  Neurological:     General: No focal deficit present.     Mental Status: He is alert.     Cranial Nerves: No cranial nerve deficit.     Deep Tendon Reflexes: Reflexes normal.  Psychiatric:        Mood and Affect: Mood normal.        Behavior: Behavior normal.       Assessment & Plan:   Will re-establish with PCP for routine care and to address family hx colon cancer and personal screening  Encouraged to add 30 min brisk walk daily for exercise, stress reduction, CV support.  Monitor salt intake - avoid processed and Fast Foods. No bottles, cans, avoid refrigerator door condiments Think fresh and frozen, from the land  Labs to be reported as available

## 2020-04-15 LAB — LIPID PANEL
Chol/HDL Ratio: 4.1 ratio (ref 0.0–5.0)
Cholesterol, Total: 178 mg/dL (ref 100–199)
HDL: 43 mg/dL (ref >39)
LDL Chol Calc (NIH): 118 mg/dL — ABNORMAL HIGH (ref 0–99)
Triglycerides: 92 mg/dL (ref 0–149)
VLDL Cholesterol Cal: 17 mg/dL (ref 5–40)

## 2020-04-15 LAB — GLUCOSE, RANDOM: Glucose: 97 mg/dL (ref 65–99)

## 2021-03-23 ENCOUNTER — Ambulatory Visit: Payer: Managed Care, Other (non HMO) | Admitting: Physician Assistant

## 2021-03-23 ENCOUNTER — Encounter: Payer: Self-pay | Admitting: Physician Assistant

## 2021-03-23 VITALS — BP 150/80 | HR 68 | Temp 97.1°F | Resp 16 | Ht 71.0 in | Wt 188.0 lb

## 2021-03-23 DIAGNOSIS — Z008 Encounter for other general examination: Secondary | ICD-10-CM

## 2021-03-23 DIAGNOSIS — Z0000 Encounter for general adult medical examination without abnormal findings: Secondary | ICD-10-CM | POA: Diagnosis not present

## 2021-03-23 NOTE — Progress Notes (Signed)
Subjective:    Patient ID: Tyler Buckley, male    DOB: [birth year], 50 y.o.   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 Stress has gone up with job advancements as well as the situational stressors experienced by Scientist, research (life sciences) and the public as well as Covid constraints.  Tyler Buckley exercises in a gym 4-5 times a week for personal stress relief and well-being.  Reports [facility] as PCP but hasn't been seen in a number of years now realizing probably 3-4-5. Reminded must be seen routinely to maintain affiliation.  Mother had colon cancer and Tyler Buckley has planned to get to screening colonoscopy but not yet accomplished. Not having any symptoms- needs to connect with PCP to be referred for screening. Wife is encouraging exam.  Takes NO medications   Did not have Covid vaccine and does not plan to get it He and his wife both had Covid a few months ago- relatively short and uncomplicated course reported  Review of Systems Denies concerns except slightly elevated BP noted today. He never knew his father and as he is leaving his 31's has become more concerned about what that medical history might reveal if he knew it.    Objective:   Physical Exam Constitutional:      Appearance: Normal appearance. He is normal weight.     Comments: Mildly overweight but very gym fit  HENT:     Head: Normocephalic and atraumatic.     Right Ear: Tympanic membrane, ear canal and external ear normal.     Left Ear: Tympanic membrane, ear canal and external ear normal.     Ears:     Comments: Qtips daily    Nose: Nose normal.     Mouth/Throat:     Mouth: Mucous membranes are moist.     Comments: DDS q 6 months Eyes:     Extraocular Movements: Extraocular movements intact.     Conjunctiva/sclera: Conjunctivae normal.  Cardiovascular:     Rate and Rhythm: Normal rate and regular rhythm.     Pulses: Normal pulses.     Heart sounds: Normal  heart sounds.  Pulmonary:     Effort: Pulmonary effort is normal.     Breath sounds: Normal breath sounds.  Abdominal:     General: Abdomen is flat.     Palpations: Abdomen is soft. There is no mass.     Tenderness: There is no abdominal tenderness. There is no guarding.     Comments: Mildlly overweight by BMI, appears fit  Genitourinary:    Comments: defer Musculoskeletal:        General: Normal range of motion.     Cervical back: Normal range of motion and neck supple.     Comments: Squat slightly limited - encourage muscle stretching before and after work outs  Lymphadenopathy:     Cervical: No cervical adenopathy.  Skin:    General: Skin is warm and dry.     Capillary Refill: Capillary refill takes less than 2 seconds.  Neurological:     General: No focal deficit present.     Mental Status: He is alert.     Cranial Nerves: No cranial nerve deficit.     Deep Tendon Reflexes: Reflexes normal.  Psychiatric:        Mood and Affect: Mood normal.        Behavior: Behavior normal.     Comments: Body language suggests tension, stress, ,  Assessment & Plan:  Questions fielded and recommendations made.  Encourage connecting with PCP and set up routine annual for first of the year.  to develop careplan screening study Watch dietary salts - think fresh and frozen,eat close to the land, rainbow daily Avoid bottles and cans, processed Often has processed lunch meats for mid-day sandwich Read labels ( if you can't read it, don't eat it )-  Has protein bars for breakfast commonly- read labels-processed Trim a few pounds Stress control, relaxation techniques- daily 30 minute walk  ( 15x 2) good active pace slightly increased breathing and heart rate throughout Patient reports My Chart active Labs to be reported as available.

## 2021-03-24 LAB — LIPID PANEL
Chol/HDL Ratio: 4 ratio (ref 0.0–5.0)
Cholesterol, Total: 165 mg/dL (ref 100–199)
HDL: 41 mg/dL (ref >39)
LDL Chol Calc (NIH): 105 mg/dL — ABNORMAL HIGH (ref 0–99)
Triglycerides: 103 mg/dL (ref 0–149)
VLDL Cholesterol Cal: 19 mg/dL (ref 5–40)

## 2021-03-24 LAB — GLUCOSE, RANDOM: Glucose: 110 mg/dL — ABNORMAL HIGH (ref 65–99)

## 2022-03-15 NOTE — Progress Notes (Deleted)
    New patient visit   Patient: Tyler Buckley   DOB: 1971-01-05   51 y.o. Male  MRN: [id number] Visit Date: 03/17/2022  Today's healthcare provider: Jacky Kindle, FNP   No chief complaint on file.  Subjective    Tyler Buckley is a 51 y.o. male who presents today as a new patient to establish care.  HPI  ***  Past Medical History:  Diagnosis Date   Hernia    Past Surgical History:  Procedure Laterality Date   HERNIA REPAIR     hernia repaired     Family Status  Relation Name Status   Mother  Deceased       healthy   Father  (Not Specified)       unknown   Brother  Alive   Family History  Problem Relation Age of Onset   Cancer Mother    Social History   Socioeconomic History   Marital status: Married    Spouse name: Not on file   Number of children: Not on file   Years of education: Not on file   Highest education level: Not on file  Occupational History   Not on file  Tobacco Use   Smoking status: Never   Smokeless tobacco: Never  Substance and Sexual Activity   Alcohol use: No   Drug use: No   Sexual activity: Yes  Other Topics Concern   Not on file  Social History Narrative   Regularly exercises. Full time.    Social Determinants of Health   Financial Resource Strain: Not on file  Food Insecurity: Not on file  Transportation Needs: Not on file  Physical Activity: Not on file  Stress: Not on file  Social Connections: Not on file   No outpatient medications prior to visit.   No facility-administered medications prior to visit.   Allergies  Allergen Reactions   Other     Immunization History  Administered Date(s) Administered   Moderna Sars-Covid-2 Vaccination 07/25/2019, 08/23/2019    Health Maintenance  Topic Date Due   TETANUS/TDAP  Never done   COLONOSCOPY (Pts 45-64yrs Insurance coverage will need to be confirmed)  Never done   COVID-19 Vaccine (3 - Moderna series) 10/18/2019   Zoster Vaccines- Shingrix (1 of 2) Never  done   INFLUENZA VACCINE  02/22/2022   Hepatitis C Screening  Completed   HIV Screening  Completed   HPV VACCINES  Aged Out    No care team member to display  Review of Systems  {Labs  Heme  Chem  Endocrine  Serology  Results Review (optional):23779}   Objective    There were no vitals taken for this visit. {Show previous vital signs (optional):23777}  Physical Exam ***  Depression Screen     No data to display         No results found for any visits on 03/17/22.  Assessment & Plan     ***  No follow-ups on file.     {provider attestation***:1}   Jacky Kindle, FNP  [facility] [phone] (phone) [phone] (fax)  [facility]

## 2022-03-17 ENCOUNTER — Ambulatory Visit: Payer: Self-pay | Admitting: Family Medicine

## 2023-08-08 ENCOUNTER — Ambulatory Visit: Payer: Managed Care, Other (non HMO)

## 2023-08-08 DIAGNOSIS — Z800 Family history of malignant neoplasm of digestive organs: Secondary | ICD-10-CM | POA: Diagnosis not present

## 2023-08-08 DIAGNOSIS — Z1211 Encounter for screening for malignant neoplasm of colon: Secondary | ICD-10-CM | POA: Diagnosis present

## 2023-08-08 DIAGNOSIS — K640 First degree hemorrhoids: Secondary | ICD-10-CM | POA: Diagnosis not present
